# Patient Record
Sex: Female | Born: 1990 | Race: Black or African American | Hispanic: No | Marital: Single | Smoking: Never smoker
Health system: Southern US, Community
[De-identification: ages and names within clinical notes are randomized; demographics above are authoritative.]

## PROBLEM LIST (undated history)

## (undated) HISTORY — PX: BACK SURGERY: SHX140

---
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
## 2014-10-30 ENCOUNTER — Encounter: Payer: Self-pay | Admitting: Emergency Medicine

## 2014-10-30 ENCOUNTER — Emergency Department
Admission: EM | Admit: 2014-10-30 | Discharge: 2014-10-30 | Disposition: A | Discharge status: Home / Self Care | Payer: Self-pay | Attending: Student | Admitting: Student

## 2014-10-30 DIAGNOSIS — Z23 Encounter for immunization: Secondary | ICD-10-CM | POA: Insufficient documentation

## 2014-10-30 DIAGNOSIS — IMO0002 Reserved for concepts with insufficient information to code with codable children: Secondary | ICD-10-CM

## 2014-10-30 DIAGNOSIS — Y9289 Other specified places as the place of occurrence of the external cause: Secondary | ICD-10-CM | POA: Insufficient documentation

## 2014-10-30 DIAGNOSIS — Y998 Other external cause status: Secondary | ICD-10-CM | POA: Insufficient documentation

## 2014-10-30 DIAGNOSIS — Y9389 Activity, other specified: Secondary | ICD-10-CM | POA: Insufficient documentation

## 2014-10-30 DIAGNOSIS — W260XXA Contact with knife, initial encounter: Secondary | ICD-10-CM | POA: Insufficient documentation

## 2014-10-30 DIAGNOSIS — S61221A Laceration with foreign body of left index finger without damage to nail, initial encounter: Secondary | ICD-10-CM | POA: Insufficient documentation

## 2014-10-30 MED ORDER — TETANUS-DIPHTH-ACELL PERTUSSIS 5-2.5-18.5 LF-MCG/0.5 IM SUSP
INTRAMUSCULAR | Status: AC
  Administered 2014-10-30: 0.5 mL via INTRAMUSCULAR
  Filled 2014-10-30: qty 0.5

## 2014-10-30 MED ORDER — IBUPROFEN 800 MG PO TABS
800.0000 mg | ORAL_TABLET | Freq: Once | ORAL | Status: AC
  Administered 2014-10-30: 800 mg via ORAL

## 2014-10-30 MED ORDER — HYDROCODONE-ACETAMINOPHEN 5-325 MG PO TABS
1.0000 | ORAL_TABLET | Freq: Once | ORAL | Status: AC
  Administered 2014-10-30: 1 via ORAL

## 2014-10-30 MED ORDER — IBUPROFEN 800 MG PO TABS: 800.0000 mg | ORAL_TABLET | Freq: Three times a day (TID) | ORAL | Status: DC | PRN

## 2014-10-30 MED ORDER — HYDROCODONE-ACETAMINOPHEN 5-325 MG PO TABS: 1.0000 | ORAL_TABLET | ORAL | Status: DC | PRN

## 2014-10-30 MED ORDER — IBUPROFEN 800 MG PO TABS
ORAL_TABLET | ORAL | Status: AC
  Administered 2014-10-30: 800 mg via ORAL
  Filled 2014-10-30: qty 1

## 2014-10-30 MED ORDER — TETANUS-DIPHTH-ACELL PERTUSSIS 5-2.5-18.5 LF-MCG/0.5 IM SUSP
0.5000 mL | Freq: Once | INTRAMUSCULAR | Status: AC
  Administered 2014-10-30: 0.5 mL via INTRAMUSCULAR

## 2014-10-30 MED ORDER — HYDROCODONE-ACETAMINOPHEN 5-325 MG PO TABS
ORAL_TABLET | ORAL | Status: AC
  Administered 2014-10-30: 1 via ORAL
  Filled 2014-10-30: qty 1

## 2014-10-30 NOTE — ED Notes (Signed)
Pt presents to ED with laceration to the 2nd digit on her left hand while trying to cut a bag of fries at home with a knife. Bleeding controlled. Laceration noted to tip of finger.

## 2014-10-30 NOTE — ED Notes (Signed)
Pt. Has 2 cm laceration to the tip of the 2nd finger to lt. Hand.  Bleeding controlled at this time.  Pt. Soaking hand.

## 2014-10-30 NOTE — ED Provider Notes (Signed)
[facility] Emergency Department Provider Note ____________________________________________  Time seen: ----------------------------------------- 9:50 PM on 10/30/2014 -----------------------------------------    I have reviewed the triage vital signs and the nursing notes.   HISTORY  Chief Complaint Extremity Laceration    HPI Betty Clark is a 24 y.o. female who presents with laceration to left index, distal finger, occurred prior to arrival with a knife while cutting a bag of fries.  Bleeding controlled.  History reviewed. No pertinent past medical history.  There are no active problems to display for this patient.   Past Surgical History  Procedure Laterality Date  . Back surgery      Current Outpatient Rx  Name  Route  Sig  Dispense  Refill  . HYDROcodone-acetaminophen (NORCO) 5-325 MG per tablet   Oral   Take 1 tablet by mouth every 4 (four) hours as needed for moderate pain.   6 tablet   0   . ibuprofen (ADVIL,MOTRIN) 800 MG tablet   Oral   Take 1 tablet (800 mg total) by mouth every 8 (eight) hours as needed.   15 tablet   0     Allergies Review of patient's allergies indicates no known allergies.  No family history on file.  Social History History  Substance Use Topics  . Smoking status: Never Smoker   . Smokeless tobacco: Never Used  . Alcohol Use: Yes    Review of Systems  Constitutional: Negative for fever. Eyes: Negative for visual changes. ENT: Negative for sore throat. Cardiovascular: Negative for chest pain. Respiratory: Negative for shortness of breath. Gastrointestinal: Negative for abdominal pain, vomiting and diarrhea. Genitourinary: Negative for dysuria. Musculoskeletal: Negative for back pain. Skin: Negative for rash. Neurological: Negative for headaches, focal weakness or numbness.   10-point ROS otherwise negative.  ____________________________________________   PHYSICAL EXAM:  VITAL  SIGNS: ED Triage Vitals  Enc Vitals Group     BP 10/30/14 2023 128/78 mmHg     Pulse Rate 10/30/14 2023 64     Resp --      Temp 10/30/14 2023 98.6 F (37 C)     Temp Source 10/30/14 2023 Oral     SpO2 10/30/14 2023 99 %     Weight 10/30/14 2023 210 lb (95.255 kg)     Height 10/30/14 2023 5\' 8"  (1.727 m)     Head Cir --      Peak Flow --      Pain Score 10/30/14 2024 8     Pain Loc --      Pain Edu? --      Excl. in GC? --     Constitutional: Alert and oriented. Well appearing and in no distress.  Skin:  Skin is warm, dry and intact. No rash noted. 1cm laceration to index finger, left. Psychiatric: Mood and affect are normal. Speech and behavior are normal. Patient exhibits appropriate insight and judgment.  ____________________________________________    LABS (pertinent positives/negatives)    ____________________________________________   EKG    ____________________________________________    RADIOLOGY  Not indicated.  ____________________________________________   PROCEDURES  Procedure(s) performed: LACERATION REPAIR Performed by: Ignacia Bayleyobert Oretha Weismann Authorized by: Ignacia Bayleyobert Connor Foxworthy Consent: Verbal consent obtained. Risks and benefits: risks, benefits and alternatives were discussed Consent given by: patient Patient identity confirmed: provided demographic data Prepped and Draped in normal sterile fashion Wound explored  Laceration Location: left index, distal tip  Laceration Length: one cm  No Foreign Bodies seen or palpated  Anesthesia: local infiltration  Local anesthetic: lidocaine  1%  no epinephrine  Anesthetic total: 3 ml  Irrigation method: syringe Amount of cleaning: standard   Number of sutures: 3  Technique: simple interrupted  Patient tolerance: Patient tolerated the procedure well with no immediate complications.   Critical Care performed: No  ____________________________________________   INITIAL IMPRESSION / ASSESSMENT AND  PLAN / ED COURSE  Laceration  See procedure note.   Given ibuprofen and norco for pain control.  Follow up 7 days for suture removal.  Tdap given in ER.  Pertinent labs & imaging results that were available during my care of the patient were reviewed by me and considered in my medical decision making (see chart for details).  ____________________________________________   FINAL CLINICAL IMPRESSION(S) / ED DIAGNOSES  Final diagnoses:  Laceration      Ignacia Bayleyobert Eann Cleland, PA-C 10/30/14 2231  Gayla DossEryka A Gayle, MD 10/31/14 [id number]

## 2014-11-11 ENCOUNTER — Emergency Department
Admission: EM | Admit: 2014-11-11 | Discharge: 2014-11-11 | Disposition: A | Discharge status: Home / Self Care | Payer: Self-pay | Attending: Emergency Medicine | Admitting: Emergency Medicine

## 2014-11-11 DIAGNOSIS — Z4802 Encounter for removal of sutures: Secondary | ICD-10-CM | POA: Insufficient documentation

## 2014-11-11 NOTE — ED Provider Notes (Signed)
[facility] Emergency Department Provider Note ?____________________________________________ ? Time seen: 7:32 PM on 11/11/2014 ----------------------------------------- ? I have reviewed the triage vital signs and the nursing notes. ________ HISTORY ? Chief Complaint Suture / Staple Removal  HPI  Betty Clark is a 24 y.o. female Patient presents for suture removal.   No past medical history on file.  There are no active problems to display for this patient. ? Past Surgical History  Procedure Laterality Date  . Back surgery     ? Current Outpatient Rx  Name  Route  Sig  Dispense  Refill  . HYDROcodone-acetaminophen (NORCO) 5-325 MG per tablet   Oral   Take 1 tablet by mouth every 4 (four) hours as needed for moderate pain.   6 tablet   0   . ibuprofen (ADVIL,MOTRIN) 800 MG tablet   Oral   Take 1 tablet (800 mg total) by mouth every 8 (eight) hours as needed.   15 tablet   0   ? Allergies Review of patient's allergies indicates no known allergies. ? No family history on file. ? Social History History  Substance Use Topics  . Smoking status: Never Smoker   . Smokeless tobacco: Never Used  . Alcohol Use: Yes   Review of Systems Constitutional: Negative for fever. HEENT:  Normocephalic/atraumatic. Negative for visual/hearingchanges, sore throat, or nasal congestion. Cardiovascular: Negative for chest pain. Respiratory: Negative for shortness of breath. Musculoskeletal: Negative for back pain. Skin: Denies pain, redness, or discharge Neurological: Negative for headaches, focal weakness or numbness. Hematological/Lymphatic:Negative for enlarged lymph nodes  10-point ROS otherwise negative. ____________________________________________  PHYSICAL EXAM:  VITAL SIGNS: ED Triage Vitals  Enc Vitals Group     BP 11/11/14 1912 128/78 mmHg     Pulse Rate 11/11/14 1912 76     Resp 11/11/14 1912 18     Temp 11/11/14 1912 97.9 F (36.6  C)     Temp Source 11/11/14 1912 Oral     SpO2 11/11/14 1912 100 %     Weight 11/11/14 1912 210 lb (95.255 kg)     Height 11/11/14 1912 5\' 8"  (1.727 m)     Head Cir --      Peak Flow --      Pain Score 11/11/14 1913 0     Pain Loc --      Pain Edu? --      Excl. in GC? --    Constitutional: Alert and oriented. Well appearing and in no distress. HEENT: Normocephalic and atraumatic.Conjunctivae are normal. PERRL. Normal extraocular movements. Mucous membranes are moist. Skin:  The wound is well healed without signs of infection. Psychiatric: Mood and affect are normal. Speech and behavior are normal. Patient exhibits appropriate insight and judgment. _____________ PROCEDURES ? SUTURE REMOVAL Performed by: Lissa HoardMenshew, Jissell Trafton V Bacon  Consent: Verbal consent obtained. Consent given by: patient Required items: required blood products, implants, devices, and special equipment available Time out: Immediately prior to procedure a "time out" was called to verify the correct patient, procedure, equipment, support staff and site/side marked as required.  Location: Left Index (2nd) Fingertip  Wound Appearance: clean  Sutures/Staples Removed: 3  Patient tolerance: Patient tolerated the procedure well with no immediate complications. ______________________________________________________ INITIAL IMPRESSION / ASSESSMENT AND PLAN / ED COURSE ? The sutures are removed. Wound care and activity instructions given. Return prn.  Pertinent labs & imaging results that were available during my care of the patient were reviewed by me and considered in my medical decision  making (see chart for details).  ____________________________________________ FINAL CLINICAL IMPRESSION(S) / ED DIAGNOSES?  Final diagnoses:  Visit for suture removal      Lissa HoardJenise V Bacon Modine Oppenheimer, PA-C 11/11/14 1938  Charlesetta IvoryJenise V Bacon Boulevard GardensMenshew, PA-C 11/11/14 1949  Governor Rooksebecca Lord, MD 11/11/14 2149

## 2014-11-11 NOTE — Discharge Instructions (Signed)
Suture Removal, Care After Refer to this sheet in the next few weeks. These instructions provide you with information on caring for yourself after your procedure. Your health care provider may also give you more specific instructions. Your treatment has been planned according to current medical practices, but problems sometimes occur. Call your health care provider if you have any problems or questions after your procedure. WHAT TO EXPECT AFTER THE PROCEDURE After your stitches (sutures) are removed, it is typical to have the following:  Some discomfort and swelling in the wound area.  Slight redness in the area. HOME CARE INSTRUCTIONS   If you have skin adhesive strips over the wound area, do not take the strips off. They will fall off on their own in a few days. If the strips remain in place after 14 days, you may remove them.  Change any bandages (dressings) at least once a day or as directed by your health care provider. If the bandage sticks, soak it off with warm, soapy water.  Apply cream or ointment only as directed by your health care provider. If using cream or ointment, wash the area with soap and water 2 times a day to remove all the cream or ointment. Rinse off the soap and pat the area dry with a clean towel.  Keep the wound area dry and clean. If the bandage becomes wet or dirty, or if it develops a bad smell, change it as soon as possible.  Continue to protect the wound from injury.  Use sunscreen when out in the sun. New scars become sunburned easily. SEEK MEDICAL CARE IF:  You have increasing redness, swelling, or pain in the wound.  You see pus coming from the wound.  You have a fever.  You notice a bad smell coming from the wound or dressing.  Your wound breaks open (edges not staying together). Document Released: 03/08/2001 Document Revised: 04/03/2013 Document Reviewed: 01/23/2013 [facility] Patient Information 2015 [employer] [place]. This information is not  intended to replace advice given to you by your health care provider. Make sure you discuss any questions you have with your health care provider.   Keep the wound clean, dry, and covered.

## 2014-11-11 NOTE — ED Notes (Signed)
Had suture placed to left 3rd finger and is here for suture removal

## 2015-12-30 ENCOUNTER — Emergency Department
Admission: EM | Admit: 2015-12-30 | Discharge: 2015-12-30 | Disposition: A | Discharge status: Home / Self Care | Payer: Self-pay | Attending: Emergency Medicine | Admitting: Emergency Medicine

## 2015-12-30 ENCOUNTER — Emergency Department: Payer: Self-pay

## 2015-12-30 DIAGNOSIS — S61307A Unspecified open wound of left little finger with damage to nail, initial encounter: Secondary | ICD-10-CM | POA: Insufficient documentation

## 2015-12-30 DIAGNOSIS — Y929 Unspecified place or not applicable: Secondary | ICD-10-CM | POA: Insufficient documentation

## 2015-12-30 DIAGNOSIS — S61309A Unspecified open wound of unspecified finger with damage to nail, initial encounter: Secondary | ICD-10-CM

## 2015-12-30 DIAGNOSIS — Y999 Unspecified external cause status: Secondary | ICD-10-CM | POA: Insufficient documentation

## 2015-12-30 DIAGNOSIS — Y9389 Activity, other specified: Secondary | ICD-10-CM | POA: Insufficient documentation

## 2015-12-30 DIAGNOSIS — S61305A Unspecified open wound of left ring finger with damage to nail, initial encounter: Secondary | ICD-10-CM | POA: Insufficient documentation

## 2015-12-30 MED ORDER — HYDROCODONE-ACETAMINOPHEN 5-325 MG PO TABS
1.0000 | ORAL_TABLET | Freq: Three times a day (TID) | ORAL | Status: DC | PRN
Start: 2015-12-30 — End: 2024-03-25

## 2015-12-30 MED ORDER — BACITRACIN ZINC 500 UNIT/GM EX OINT
TOPICAL_OINTMENT | CUTANEOUS | Status: AC
  Filled 2015-12-30: qty 1.8

## 2015-12-30 MED ORDER — BACITRACIN ZINC 500 UNIT/GM EX OINT
TOPICAL_OINTMENT | CUTANEOUS | Status: AC
  Filled 2015-12-30: qty 0.9

## 2015-12-30 MED ORDER — BACITRACIN ZINC 500 UNIT/GM EX OINT
TOPICAL_OINTMENT | Freq: Every day | CUTANEOUS | Status: DC
  Administered 2015-12-30: via TOPICAL

## 2015-12-30 MED ORDER — LIDOCAINE HCL (PF) 1 % IJ SOLN
INTRAMUSCULAR | Status: DC
Start: 2015-12-30 — End: 2015-12-31
  Filled 2015-12-30: qty 5

## 2015-12-30 MED ORDER — LIDOCAINE HCL (PF) 1 % IJ SOLN
5.0000 mL | Freq: Once | INTRAMUSCULAR | Status: AC
  Administered 2015-12-30: 5 mL
  Filled 2015-12-30: qty 5
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
## 2015-12-30 NOTE — Discharge Instructions (Signed)
Keep the wounds clean, dry, and covered. Change the dressing daily as needed. Use antibiotic ointment with dressing changes. Wear the splint for protection of the nail bed. Return to the ED in 10-14 days for suture removal. Return at any time for wound checks.   Fingernail or Toenail Removal Fingernail or toenail removal is a surgical procedure to take off a nail from your finger or your toe. You may need to have a fingernail or toenail removed if it has an abnormal shape (deformity) or if it is severely injured. A fingernail or toenail may also be removed due to a bacterial infection, a severe ingrown toenail, or a fungal infection that has failed treatment with antifungal medicines. LET [facility] PROVIDER KNOW ABOUT:  Any allergies you have.  All medicines you are taking, including vitamins, herbs, eye drops, creams, and over-the-counter medicines.  Previous problems you or members of your family have had with the use of anesthetics.  Any blood disorders you have.  Previous surgeries you have had.  Any medical conditions you may have. RISKS AND COMPLICATIONS Generally, this is a safe procedure. However, problems may occur, including:  Pain.  Bleeding.  Infection.  Regrowth of a deformed nail. BEFORE THE PROCEDURE  Ask your health care provider about changing or stopping your regular medicines. This is especially important if you are taking diabetes medicines or blood thinners.  Follow instructions from your health care provider about eating or drinking restrictions.  Plan to have someone take you home after the procedure. PROCEDURE  An IV tube will be inserted into one of your veins.  You will be given one or more of the following:  A medicine that helps you relax (sedative).  A medicine that numbs the area (local anesthetic).  After your toe or finger is numb, your health care provider will insert a blunt instrument under your nail to lift it up.  In some  cases, your health care provider may also make a cut (incision) in your nail.  After your nail is lifted away from your toe or finger, your health care provider will detach it from your nail bed.  A germ-killing bandage (antiseptic dressing) will be put on your toe or finger. The procedure may vary among health care providers and hospitals. AFTER THE PROCEDURE  Your blood pressure, heart rate, breathing rate, and blood oxygen level will be monitored often until the medicines you were given have worn off.  It is common to have some pain after nail removal. You will be given pain medicine as needed.  You may be given a prescription for pain medicine and antibiotic medicine.  If you had a toenail removed, you will be given a surgical shoe to wear while you recover.  If you had a fingernail removed, you may be given a finger splint to wear while you recover.   This information is not intended to replace advice given to you by your health care provider. Make sure you discuss any questions you have with your health care provider.   Document Released: 03/12/2003 Document Revised: 10/28/2014 Document Reviewed: 06/11/2014 Elsevier Interactive Patient Education 2016 Elsevier Inc.  Nail Avulsion Injury Nail avulsion means that you have lost the whole, or part of a nail. The nail will usually grow back in 2 to 6 months. If your injury damaged the growth center of the nail, the nail may be deformed, split, or not stuck to the nail bed. Sometimes the avulsed nail is stitched back in place.  This provides temporary protection to the nail bed until the new nail grows in.  HOME CARE INSTRUCTIONS   Raise (elevate) your injury as much as possible.  Protect the injury and cover it with bandages (dressings) or splints as instructed.  Change dressings as instructed. SEEK MEDICAL CARE IF:   There is increasing pain, redness, or swelling.  You cannot move your fingers or toes.   This information is not  intended to replace advice given to you by your health care provider. Make sure you discuss any questions you have with your health care provider.   Document Released: 07/21/2004 Document Revised: 09/05/2011 Document Reviewed: 05/15/2009 Elsevier Interactive Patient Education [employer] Elsevier Inc.
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
## 2016-01-18 ENCOUNTER — Encounter: Payer: Self-pay | Admitting: Emergency Medicine

## 2016-01-18 ENCOUNTER — Emergency Department
Admission: EM | Admit: 2016-01-18 | Discharge: 2016-01-18 | Disposition: A | Discharge status: Home / Self Care | Payer: Self-pay | Attending: Emergency Medicine | Admitting: Emergency Medicine

## 2016-01-18 DIAGNOSIS — Z4802 Encounter for removal of sutures: Secondary | ICD-10-CM | POA: Insufficient documentation

## 2016-01-18 DIAGNOSIS — Z5189 Encounter for other specified aftercare: Secondary | ICD-10-CM

## 2016-01-18 NOTE — ED Provider Notes (Signed)
[facility] Emergency Department Provider Note ____________________________________________  Time seen: 2108  I have reviewed the triage vital signs and the nursing notes.  HISTORY  Chief Complaint  Suture / Staple Removal  HPI Betty Clark is a 25 y.o. female returns to the ED for wound check and suture removal. I treated this patient approximately 3 weeks prior for traumatic nail avulsion 2. She was treated with an artificial nail bed splint at the time. She returns for wound check and suture removal as directed. She denies any interim complaints.  History reviewed. No pertinent past medical history.  There are no active problems to display for this patient.   Past Surgical History:  Procedure Laterality Date  . BACK SURGERY      Current Outpatient Rx  . Order #: [id number] Class: Print  . Order #: [id number] Class: Print    Allergies Review of patient's allergies indicates no known allergies.  No family history on file.  Social History Social History  Substance Use Topics  . Smoking status: Never Smoker  . Smokeless tobacco: Never Used  . Alcohol use Yes    Review of Systems  Constitutional: Negative for fever. Musculoskeletal: Negative for back pain. Skin: Negative for rash. Nailbed splint in place Neurological: Negative for headaches, focal weakness or numbness. ____________________________________________  PHYSICAL EXAM:  VITAL SIGNS: ED Triage Vitals [01/18/16 2032]  Enc Vitals Group     BP (!) 123/57     Pulse Rate (!) 53     Resp 18     Temp 98 F (36.7 C)     Temp Source Oral     SpO2 98 %     Weight 220 lb (99.8 kg)     Height 5\' 8"  (1.727 m)     Head Circumference      Peak Flow      Pain Score      Pain Loc      Pain Edu?      Excl. in GC?    Constitutional: Alert and oriented. Well appearing and in no distress. Head: Normocephalic and atraumatic. Cardiovascular: Normal distal pulses and cap  refill Musculoskeletal: Nontender with normal range of motion in all extremities.  Neurologic:  Normal gait without ataxia. Normal speech and language. No gross focal neurologic deficits are appreciated. Skin:  Skin is warm, dry and intact. No rash noted. Left ring finger with stable, well-healed nailbed. Nailbed splint in place with a single nylon suture in figure-8 fashion.  ____________________________________________  PROCEDURES  SUTURE REMOVAL Performed by: Lissa Hoard  Consent: Verbal consent obtained. Patient identity confirmed: provided demographic data Time out: Immediately prior to procedure a "time out" was called to verify the correct patient, procedure, equipment, support staff and site/side marked as required.  Location details: left ring finger  Wound Appearance: clean  Sutures/Staples Removed: 1 suture  Facility: sutures placed in this facility Patient tolerance: Patient tolerated the procedure well with no immediate complications. ____________________________________________  INITIAL IMPRESSION / ASSESSMENT AND PLAN / ED COURSE  Patient with a traumatic nail avulsion status post artificial nail splint. She returns today for suture removal. Patient's nailbed is well-healed without incidence.  Clinical Course   ____________________________________________  FINAL CLINICAL IMPRESSION(S) / ED DIAGNOSES  Final diagnoses:  Visit for suture removal  Visit for wound check     Lissa Hoard, PA-C 01/19/16 0025    Myrna Blazer, MD 01/19/16 [phone]

## 2016-01-18 NOTE — ED Notes (Addendum)
Pt in via triage for suture removal; sutures located along bed of fingernail to left fourth digit.  Pt denies any pain.

## 2016-01-18 NOTE — ED Triage Notes (Signed)
Patient ambulatory to triage with steady gait, without difficulty or distress noted; st here for stitch removal of left 4th finger; denies any c/o; st was told to come back here for removal

## 2016-02-03 ENCOUNTER — Emergency Department
Admission: EM | Admit: 2016-02-03 | Discharge: 2016-02-03 | Disposition: A | Discharge status: Home / Self Care | Payer: Self-pay | Attending: Emergency Medicine | Admitting: Emergency Medicine

## 2016-02-03 DIAGNOSIS — K029 Dental caries, unspecified: Secondary | ICD-10-CM | POA: Insufficient documentation

## 2016-02-03 MED ORDER — LIDOCAINE VISCOUS 2 % MT SOLN: 5.0000 mL | Freq: Four times a day (QID) | OROMUCOSAL | 0 refills | Status: DC | PRN

## 2016-02-03 MED ORDER — IBUPROFEN 600 MG PO TABS: 600.0000 mg | ORAL_TABLET | Freq: Three times a day (TID) | ORAL | 0 refills | Status: DC | PRN

## 2016-02-03 MED ORDER — AMOXICILLIN 500 MG PO CAPS: 500.0000 mg | ORAL_CAPSULE | Freq: Three times a day (TID) | ORAL | 0 refills | Status: DC

## 2016-02-03 NOTE — ED Notes (Signed)
Pt to ed with c/o dental pain  X 2 weeks states she does not have insurance and can't afford to have it out. Pt with small amount of swelling to face.

## 2016-02-03 NOTE — ED Provider Notes (Signed)
[facility] Emergency Department Provider Note   ____________________________________________   First MD Initiated Contact with Patient 02/03/16 [phone]     (approximate)  I have reviewed the triage vital signs and the nursing notes.   HISTORY  Chief Complaint Dental Pain    HPI Betty Clark is a 25 y.o. female patient complaining of dental pain for 2-1/2 weeks. Patient stated pain is located in the right upper molars. Patient states pain is increasing in the past 3-4 days. Patient is rating the pain as a 10 over 10. Patient denies any fever or swelling. No palliative measures taken for this complaint.   No past medical history on file.  There are no active problems to display for this patient.   Past Surgical History:  Procedure Laterality Date  . BACK SURGERY      Prior to Admission medications   Medication Sig Start Date End Date Taking? Authorizing Provider  amoxicillin (AMOXIL) 500 MG capsule Take 1 capsule (500 mg total) by mouth 3 (three) times daily. 02/03/16   Joni Reiningonald K Kasey Hansell, PA-C  HYDROcodone-acetaminophen (NORCO) 5-325 MG tablet Take 1 tablet by mouth 3 (three) times daily as needed for moderate pain. 12/30/15   Jenise V Bacon Menshew, PA-C  ibuprofen (ADVIL,MOTRIN) 600 MG tablet Take 1 tablet (600 mg total) by mouth every 8 (eight) hours as needed. 02/03/16   Joni Reiningonald K Stefanee Mckell, PA-C  ibuprofen (ADVIL,MOTRIN) 800 MG tablet Take 1 tablet (800 mg total) by mouth every 8 (eight) hours as needed. 10/30/14   Ignacia Bayleyobert Tumey, PA-C  lidocaine (XYLOCAINE) 2 % solution Use as directed 5 mLs in the mouth or throat every 6 (six) hours as needed for mouth pain. 02/03/16   Joni Reiningonald K Mara Favero, PA-C    Allergies Review of patient's allergies indicates no known allergies.  No family history on file.  Social History Social History  Substance Use Topics  . Smoking status: Never Smoker  . Smokeless tobacco: Never Used  . Alcohol use Yes    Review of  Systems Constitutional: No fever/chills Eyes: No visual changes. ENT: No sore throat. Dental pain Cardiovascular: Denies chest pain. Respiratory: Denies shortness of breath. Gastrointestinal: No abdominal pain.  No nausea, no vomiting.  No diarrhea.  No constipation. Genitourinary: Negative for dysuria. Musculoskeletal: Negative for back pain. Skin: Negative for rash. Neurological: Negative for headaches, focal weakness or numbness.    ____________________________________________   PHYSICAL EXAM:  VITAL SIGNS: ED Triage Vitals  Enc Vitals Group     BP 02/03/16 0657 140/89     Pulse Rate 02/03/16 0657 71     Resp --      Temp 02/03/16 0657 98.2 F (36.8 C)     Temp Source 02/03/16 0657 Oral     SpO2 02/03/16 0657 99 %     Weight 02/03/16 0656 220 lb (99.8 kg)     Height 02/03/16 0656 5\' 8"  (1.727 m)     Head Circumference --      Peak Flow --      Pain Score 02/03/16 0656 10     Pain Loc --      Pain Edu? --      Excl. in GC? --     Constitutional: Alert and oriented. Well appearing and in no acute distress. Eyes: Conjunctivae are normal. PERRL. EOMI. Head: Atraumatic. Nose: No congestion/rhinnorhea. Mouth/Throat: Mucous membranes are moist. Caries identified at tooth #8 14 and 15. Oropharynx nonedematous and non-erythematous. Neck: No stridor.  No cervical spine  tenderness to palpation. Hematological/Lymphatic/Immunilogical: No cervical lymphadenopathy. Cardiovascular: Normal rate, regular rhythm. Grossly normal heart sounds.  Good peripheral circulation. Respiratory: Normal respiratory effort.  No retractions. Lungs CTAB. Gastrointestinal: Soft and nontender. No distention. No abdominal bruits. No CVA tenderness. Musculoskeletal: No lower extremity tenderness nor edema.  No joint effusions. Neurologic:  Normal speech and language. No gross focal neurologic deficits are appreciated. No gait instability. Skin:  Skin is warm, dry and intact. No rash  noted. Psychiatric: Mood and affect are normal. Speech and behavior are normal.  ____________________________________________   LABS (all labs ordered are listed, but only abnormal results are displayed)  Labs Reviewed - No data to display ____________________________________________  EKG   ____________________________________________  RADIOLOGY   ____________________________________________   PROCEDURES  Procedure(s) performed: None  Procedures  Critical Care performed: No  ____________________________________________   INITIAL IMPRESSION / ASSESSMENT AND PLAN / ED COURSE  Pertinent labs & imaging results that were available during my care of the patient were reviewed by me and considered in my medical decision making (see chart for details).  Dental pain secondary to caries. Patient given discharge care instructions. Patient given a list of dental clinics for follow-up. Patient given a prescription for amoxicillin, ibuprofen, and viscous lidocaine. Patient given a work note for today.  Clinical Course     ____________________________________________   FINAL CLINICAL IMPRESSION(S) / ED DIAGNOSES  Final diagnoses:  Pain due to dental caries      NEW MEDICATIONS STARTED DURING THIS VISIT:  New Prescriptions   AMOXICILLIN (AMOXIL) 500 MG CAPSULE    Take 1 capsule (500 mg total) by mouth 3 (three) times daily.   IBUPROFEN (ADVIL,MOTRIN) 600 MG TABLET    Take 1 tablet (600 mg total) by mouth every 8 (eight) hours as needed.   LIDOCAINE (XYLOCAINE) 2 % SOLUTION    Use as directed 5 mLs in the mouth or throat every 6 (six) hours as needed for mouth pain.     Note:  This document was prepared using Dragon voice recognition software and may include unintentional dictation errors.    Joni Reining, PA-C 02/03/16 0725    Jennye Moccasin, MD 02/03/16 [phone]

## 2016-02-03 NOTE — Discharge Instructions (Signed)
Follow-up for febrile illness of dental clinics provided OPTIONS FOR DENTAL FOLLOW UP CARE  [place] Department of Health and Human Services - Local Safety Net Dental Clinics [email or web address]://www.ncdhhs.gov/dph/oralhealth/services/safetynetclinics.htm   [facility] [phone])  [facility] [phone])  [facility] [phone]  [facility] [phone])  [facility] [phone]) This clinic caters to the indigent population and is on a lottery system. Location: [employer] [address] [facility], [address], [place] Clinic Hours: Wednesdays from 6pm - 9pm, patients seen by a lottery system. For dates, call or go to [email or web address] Services: Cleanings, fillings and simple extractions. Payment Options: DENTAL WORK IS FREE OF CHARGE. Bring proof of income or support. Best way to get seen: Arrive at 5:15 pm - this is a lottery, NOT first come/first serve, so arriving earlier will not increase your chances of being seen.     [facility] [phone] Select option 1 for emergencies   Location: [facility], [place], [address], [place] Clinic Hours: No walk-ins accepted - call the day before to schedule an appointment. Check in times are 9:30 am and 1:30 pm. Services: Simple extractions, temporary fillings, pulpectomy/pulp debridement, uncomplicated abscess drainage. Payment Options: PAYMENT IS DUE AT THE TIME OF SERVICE.  Fee is usually $100-200, additional surgical procedures (e.g. abscess drainage) may be extra. Cash, checks, Visa/MasterCard accepted.  Can file Medicaid if patient is covered for dental - patient should call case worker to check. No discount for [facility] patients. Best way to get seen: MUST call the day before and get onto the schedule. Can usually be seen the next 1-2 days. No walk-ins accepted.      [facility] [phone]   Location: [facility], [address], [place] Clinic Hours: M, W, Th, F 8am or 1:30pm, Tues 9a or 1:30 - first come/first served. Services: Simple extractions, temporary fillings, uncomplicated abscess drainage.  You do not need to be [place] resident. Payment Options: PAYMENT IS DUE AT THE TIME OF SERVICE. Dental insurance, otherwise sliding scale - bring proof of income or support. Depending on income and treatment needed, cost is usually $50-200. Best way to get seen: Arrive early as it is first come/first served.     [facility] [phone]   Location: [address] Clinic Hours: Mon-Thu 8a-5p Services: Most basic dental services including extractions and fillings. Payment Options: PAYMENT IS DUE AT THE TIME OF SERVICE. Sliding scale, up to 50% off - bring proof if income or support. Medicaid with dental option accepted. Best way to get seen: Call to schedule an appointment, can usually be seen within 2 weeks OR they will try to see walk-ins - show up at 8a or 2p (you may have to wait).     [facility] [phone] [place] RESIDENTS ONLY   Location: [facility], [address], [place], [place] [id number] Clinic Hours: By appointment only. Monday - Thursday 8am-5pm, Friday 8am-12pm Services: Cleanings, fillings, extractions. Payment Options: PAYMENT IS DUE AT THE TIME OF SERVICE. Cash, Visa or MasterCard. Sliding scale - $30 minimum per service. Best way to get seen: Come in to office, complete packet and make an appointment - need proof of income or support monies for each household member and proof of [facility] [place] residence. Usually takes about a month to get in.     [facility] °[phone] °  °Location: °[address] [place] °Clinic Hours: Walk-in Urgent Care  Dental Services are offered Monday-Friday mornings only. °The numbers of emergencies accepted daily is limited to the number of °providers available. °Maximum 15 - Mondays, Wednesdays & Thursdays °Maximum 10 - Tuesdays & Fridays °Services: °You do not need to be a [place] resident to be seen for a dental emergency. °Emergencies are defined as pain, swelling, abnormal bleeding, or dental trauma. Walkins will receive x-rays if needed. °NOTE: Dental cleaning is not an emergency. °Payment Options: °PAYMENT IS DUE AT THE TIME OF SERVICE. °Minimum co-pay is $40.00 for uninsured patients. °Minimum co-pay is $3.00 for Medicaid with dental coverage. °Dental Insurance is accepted and must be presented at time of visit. °Medicare does not cover dental. °Forms of payment: Cash, credit card, checks. °Best way to get seen: °If not previously registered with the clinic, walk-in dental registration begins at 7:15 am and is on a first come/first serve basis. °If previously registered with the clinic, call to make an appointment. °  °  °[facility] °[phone] °[place] RESIDENTS ONLY °  °Location: °[address], [place], [place] °Clinic Hours: °Mon-Thu 10a-2p °Services: Extractions only! °Payment Options: °FREE (donations accepted) - bring proof of income or support °Best way to get seen: °Call and schedule an appointment OR come at 8am on the 1st Monday of every month (except for holidays) when it is first come/first served. °  °  °[facility] °[phone] °  °Location: °[address], [place] °Clinic Hours: °Friday mornings °Services, Payment Options, Best way to get seen: °Call for info ° °

## 2016-02-03 NOTE — ED Triage Notes (Signed)
Patient ambulatory to triage with steady gait, without difficulty or distress noted; pt reports right upper dental pain x 2wks

## 2016-07-21 ENCOUNTER — Encounter: Payer: Self-pay | Admitting: Emergency Medicine

## 2016-07-21 ENCOUNTER — Emergency Department
Admission: EM | Admit: 2016-07-21 | Discharge: 2016-07-21 | Disposition: A | Discharge status: Home / Self Care | Payer: Self-pay | Attending: Emergency Medicine | Admitting: Emergency Medicine

## 2016-07-21 DIAGNOSIS — B009 Herpesviral infection, unspecified: Secondary | ICD-10-CM | POA: Insufficient documentation

## 2016-07-21 DIAGNOSIS — N3000 Acute cystitis without hematuria: Secondary | ICD-10-CM | POA: Insufficient documentation

## 2016-07-21 LAB — URINALYSIS, COMPLETE (UACMP) WITH MICROSCOPIC
Bacteria, UA: NONE SEEN
Bilirubin Urine: NEGATIVE
GLUCOSE, UA: NEGATIVE mg/dL
Hgb urine dipstick: NEGATIVE
KETONES UR: 5 mg/dL — AB
Nitrite: POSITIVE — AB
PROTEIN: 30 mg/dL — AB
Specific Gravity, Urine: 1.034 — ABNORMAL HIGH (ref 1.005–1.030)
pH: 6 (ref 5.0–8.0)

## 2016-07-21 MED ORDER — TRAMADOL HCL 50 MG PO TABS: 50.0000 mg | ORAL_TABLET | Freq: Four times a day (QID) | ORAL | 0 refills | Status: DC | PRN

## 2016-07-21 MED ORDER — LIDOCAINE HCL 2 % EX GEL
1.0000 "application " | Freq: Once | CUTANEOUS | Status: AC
  Administered 2016-07-21: 1 via TOPICAL
  Filled 2016-07-21: qty 5

## 2016-07-21 MED ORDER — LIDOCAINE 5 % EX OINT: 1.0000 "application " | TOPICAL_OINTMENT | CUTANEOUS | 0 refills | Status: DC | PRN

## 2016-07-21 MED ORDER — ACYCLOVIR 200 MG PO CAPS: 200.0000 mg | ORAL_CAPSULE | Freq: Three times a day (TID) | ORAL | 0 refills | Status: AC

## 2016-07-21 MED ORDER — SULFAMETHOXAZOLE-TRIMETHOPRIM 800-160 MG PO TABS: 1.0000 | ORAL_TABLET | Freq: Two times a day (BID) | ORAL | 0 refills | Status: DC

## 2016-07-21 NOTE — ED Notes (Signed)
See triage note  States she developed some dysuria since Tuesday

## 2016-07-21 NOTE — ED Provider Notes (Signed)
[facility] Emergency Department Provider Note  ____________________________________________  Time seen: Approximately 3:51 PM  I have reviewed the triage vital signs and the nursing notes.   HISTORY  Chief Complaint Dysuria    HPI Betty Clark is a 26 y.o. female who presents to the emergency department for evaluation of dysuria. Symptoms started 2 days ago. She also feels she may be having a herpes outbreak.She is awaiting the results of the herpes virus test from the health department. She reports her last UTI was about 2 years ago.   History reviewed. No pertinent past medical history.  There are no active problems to display for this patient.   Past Surgical History:  Procedure Laterality Date  . BACK SURGERY      Prior to Admission medications   Medication Sig Start Date End Date Taking? Authorizing Provider  acyclovir (ZOVIRAX) 200 MG capsule Take 1 capsule (200 mg total) by mouth 3 (three) times daily. 07/21/16 07/31/16  Chinita Pesterari B Ajia Chadderdon, FNP  HYDROcodone-acetaminophen (NORCO) 5-325 MG tablet Take 1 tablet by mouth 3 (three) times daily as needed for moderate pain. 12/30/15   Jenise V Bacon Menshew, PA-C  lidocaine (XYLOCAINE) 2 % solution Use as directed 5 mLs in the mouth or throat every 6 (six) hours as needed for mouth pain. 02/03/16   Joni Reiningonald K Smith, PA-C  lidocaine (XYLOCAINE) 5 % ointment Apply 1 application topically as needed. 07/21/16   Chinita Pesterari B Anaiza Behrens, FNP  sulfamethoxazole-trimethoprim (BACTRIM DS,SEPTRA DS) 800-160 MG tablet Take 1 tablet by mouth 2 (two) times daily. 07/21/16   Chinita Pesterari B Reyce Lubeck, FNP  traMADol (ULTRAM) 50 MG tablet Take 1 tablet (50 mg total) by mouth every 6 (six) hours as needed. 07/21/16   Chinita Pesterari B Eduardo Wurth, FNP    Allergies Patient has no known allergies.  No family history on file.  Social History Social History  Substance Use Topics  . Smoking status: Never Smoker  . Smokeless tobacco: Never Used  . Alcohol use  Yes    Review of Systems Constitutional: Negative for fever. Respiratory: Negative for shortness of breath or cough. Gastrointestinal: Negative for abdominal pain; Negative for nausea , Negative for vomiting. Genitourinary: Positive for dysuria , negative for vaginal discharge. Positive for painful lesions around vagina Musculoskeletal: Negative for back pain. Skin: Positive for painful lesions in vaginal area. ____________________________________________   PHYSICAL EXAM:  VITAL SIGNS: ED Triage Vitals  Enc Vitals Group     BP 07/21/16 1439 (!) 134/94     Pulse Rate 07/21/16 1439 98     Resp 07/21/16 1439 18     Temp 07/21/16 1439 98.4 F (36.9 C)     Temp Source 07/21/16 1439 Oral     SpO2 07/21/16 1439 98 %     Weight 07/21/16 1439 250 lb (113.4 kg)     Height 07/21/16 1439 5\' 7"  (1.702 m)     Head Circumference --      Peak Flow --      Pain Score 07/21/16 1442 10     Pain Loc --      Pain Edu? --      Excl. in GC? --     Constitutional: Alert and oriented. Well appearing and in no acute distress. Eyes: Conjunctivae are normal. PERRL. EOMI. Head: Atraumatic. Nose: No congestion/rhinnorhea. Mouth/Throat: Mucous membranes are moist. Respiratory: Normal respiratory effort.  No retractions. Gastrointestinal: Abdomen is soft, nontender without rebound or guarding. Genitourinary: Pelvic exam:  Musculoskeletal: No extremity tenderness nor edema.  Neurologic:  Normal speech and language. No gross focal neurologic deficits are appreciated. Speech is normal. No gait instability. Skin:  Skin of labia minora and majora with scattered, ulcerated lesions consistent with herpes simplex 2. Psychiatric: Mood and affect are normal. Speech and behavior are normal.  ____________________________________________   LABS (all labs ordered are listed, but only abnormal results are displayed)  Labs Reviewed  URINALYSIS, COMPLETE (UACMP) WITH MICROSCOPIC - Abnormal; Notable for the  following:       Result Value   Color, Urine YELLOW (*)    APPearance CLOUDY (*)    Specific Gravity, Urine 1.034 (*)    Ketones, ur 5 (*)    Protein, ur 30 (*)    Nitrite POSITIVE (*)    Leukocytes, UA MODERATE (*)    Squamous Epithelial / LPF 6-30 (*)    All other components within normal limits   ____________________________________________  RADIOLOGY  Not indicated. ____________________________________________   PROCEDURES  Procedure(s) performed: None  ____________________________________________   INITIAL IMPRESSION / ASSESSMENT AND PLAN / ED COURSE      Pertinent labs & imaging results that were available during my care of the patient were reviewed by me and considered in my medical decision making (see chart for details).  26 year old female presents to the emergency department for evaluation of dysuria and confirmation of herpes simplex 2. Dysuria started 2 days ago. Lesions on the labia started about 3-4 days ago. She denies previous herpes symptoms and this is likely her first outbreak. She will be treated with Bactrim, xylocaine jelly, tramadol, and acyclovir. She was advised to follow up with the health department for symptoms that are not improving with the medications. Safe sexual practices were discussed in detail and at length.  ___________________________________________   FINAL CLINICAL IMPRESSION(S) / ED DIAGNOSES  Final diagnoses:  Acute cystitis without hematuria  Herpes simplex type 2 infection    Note:  This document was prepared using Dragon voice recognition software and may include unintentional dictation errors.    Chinita Pester, FNP 07/21/16 1733    Charlynne Pander, MD 07/24/16 2103

## 2016-07-21 NOTE — ED Triage Notes (Signed)
Patient presents to ED via POV from home with c/o dysuria. Patient states, "It feels like I have a UTI. I also think I am having a herpes outbreak but my test from the health department hasn't come back yet".

## 2017-03-14 ENCOUNTER — Encounter: Payer: Self-pay | Admitting: Emergency Medicine

## 2017-03-14 ENCOUNTER — Emergency Department
Admission: EM | Admit: 2017-03-14 | Discharge: 2017-03-14 | Disposition: A | Discharge status: Home / Self Care | Payer: Self-pay | Attending: Emergency Medicine | Admitting: Emergency Medicine

## 2017-03-14 DIAGNOSIS — J0110 Acute frontal sinusitis, unspecified: Secondary | ICD-10-CM | POA: Insufficient documentation

## 2017-03-14 DIAGNOSIS — Z79899 Other long term (current) drug therapy: Secondary | ICD-10-CM | POA: Insufficient documentation

## 2017-03-14 MED ORDER — AMOXICILLIN-POT CLAVULANATE 875-125 MG PO TABS: 1.0000 | ORAL_TABLET | Freq: Two times a day (BID) | ORAL | 0 refills | Status: AC

## 2017-03-14 MED ORDER — PREDNISONE 10 MG PO TABS: ORAL_TABLET | ORAL | 0 refills | Status: DC

## 2017-03-14 NOTE — ED Triage Notes (Signed)
Presents with sinus pressure and nasal congestion  Sx's started last weds.   Possible fever at night but did not take temp.

## 2017-03-14 NOTE — ED Provider Notes (Signed)
[facility] Emergency Department Provider Note  ____________________________________________  Time seen: Approximately 11:21 AM  I have reviewed the triage vital signs and the nursing notes.   HISTORY  Chief Complaint Facial Pain    HPI Betty Clark is a 26 y.o. female presents to emergency department with nasal congestion and facial pain for 6 days. Patient has chills at night but has not checked her temperature. She does not smoke. She took an allergy pill that her mom gave her, which did not help. No headache, sore throat, cough, shortness of breath, chest pain, nausea, vomiting, abdominal pain, muscle aches.   History reviewed. No pertinent past medical history.  There are no active problems to display for this patient.   Past Surgical History:  Procedure Laterality Date  . BACK SURGERY      Prior to Admission medications   Medication Sig Start Date End Date Taking? Authorizing Provider  amoxicillin-clavulanate (AUGMENTIN) 875-125 MG tablet Take 1 tablet by mouth 2 (two) times daily. 03/14/17 03/24/17  Enid Derry, PA-C  HYDROcodone-acetaminophen (NORCO) 5-325 MG tablet Take 1 tablet by mouth 3 (three) times daily as needed for moderate pain. 12/30/15   Menshew, Charlesetta Ivory, PA-C  lidocaine (XYLOCAINE) 2 % solution Use as directed 5 mLs in the mouth or throat every 6 (six) hours as needed for mouth pain. 02/03/16   Joni Reining, PA-C  lidocaine (XYLOCAINE) 5 % ointment Apply 1 application topically as needed. 07/21/16   Triplett, Rulon Eisenmenger B, FNP  predniSONE (DELTASONE) 10 MG tablet Take 6 tablets on day 1, take 5 tablets on day 2, take 4 tablets on day 3, take 3 tablets on day 4, take 2 tablets on day 5, take 1 tablet on day 6 03/14/17   Enid Derry, PA-C  sulfamethoxazole-trimethoprim (BACTRIM DS,SEPTRA DS) 800-160 MG tablet Take 1 tablet by mouth 2 (two) times daily. 07/21/16   Triplett, Rulon Eisenmenger B, FNP  traMADol (ULTRAM) 50 MG tablet Take 1 tablet  (50 mg total) by mouth every 6 (six) hours as needed. 07/21/16   Chinita Pester, FNP    Allergies Patient has no known allergies.  No family history on file.  Social History Social History  Substance Use Topics  . Smoking status: Never Smoker  . Smokeless tobacco: Never Used  . Alcohol use Yes     Review of Systems  Eyes: No visual changes. No discharge. ENT: Positive for congestion and rhinorrhea. Cardiovascular: No chest pain. Respiratory: Negative for cough. No SOB. Gastrointestinal: No abdominal pain.  No nausea, no vomiting.  No diarrhea.  No constipation. Musculoskeletal: Negative for musculoskeletal pain. Skin: Negative for rash, abrasions, lacerations, ecchymosis. Neurological: Negative for headaches.   ____________________________________________   PHYSICAL EXAM:  VITAL SIGNS: ED Triage Vitals  Enc Vitals Group     BP 03/14/17 1103 133/83     Pulse Rate 03/14/17 1103 (!) 104     Resp 03/14/17 1103 18     Temp 03/14/17 1103 98.4 F (36.9 C)     Temp Source 03/14/17 1103 Oral     SpO2 03/14/17 1103 95 %     Weight 03/14/17 1111 250 lb (113.4 kg)     Height 03/14/17 1111  (1.727 m)     Head Circumference --      Peak Flow --   [facility]     Pain Loc --      Pain Edu? --      Excl.  in GC? --      Constitutional: Alert and oriented. Well appearing and in no acute distress. Eyes: Conjunctivae are normal. PERRL. EOMI. No discharge. Head: Atraumatic. ENT: No frontal and maxillary sinus tenderness.      Ears: Tympanic membranes pearly gray with good landmarks. No discharge.      Nose: Mild congestion/rhinnorhea.      Mouth/Throat: Mucous membranes are moist. Oropharynx non-erythematous. Tonsils not enlarged. No exudates. Uvula midline. Neck: No stridor.   Hematological/Lymphatic/Immunilogical: No cervical lymphadenopathy. Cardiovascular: Normal rate, regular rhythm.  Good peripheral circulation. Respiratory: Normal respiratory  effort without tachypnea or retractions. Lungs CTAB. Good air entry to the bases with no decreased or absent breath sounds. Gastrointestinal: Bowel sounds 4 quadrants. Soft and nontender to palpation. No guarding or rigidity. No palpable masses. No distention. Musculoskeletal: Full range of motion to all extremities. No gross deformities appreciated. Neurologic:  Normal speech and language. No gross focal neurologic deficits are appreciated.  Skin:  Skin is warm, dry and intact. No rash noted.   ____________________________________________   LABS (all labs ordered are listed, but only abnormal results are displayed)  Labs Reviewed - No data to display ____________________________________________  EKG   ____________________________________________  RADIOLOGY   No results found.  ____________________________________________    PROCEDURES  Procedure(s) performed:    Procedures    Medications - No data to display   ____________________________________________   INITIAL IMPRESSION / ASSESSMENT AND PLAN / ED COURSE  Pertinent labs & imaging results that were available during my care of the patient were reviewed by me and considered in my medical decision making (see chart for details).  Review of the [place] CSRS was performed in accordance of the [facility] prior to dispensing any controlled drugs.   Patient's diagnosis is consistent with sinusitis. Vital signs and exam are reassuring. Patient feels comfortable going home. Patient will be discharged home with prescriptions for Augmentin and prednisone. Patient is to follow up with PCP as needed or otherwise directed. Patient is given ED precautions to return to the ED for any worsening or new symptoms.     ____________________________________________  FINAL CLINICAL IMPRESSION(S) / ED DIAGNOSES  Final diagnoses:  Acute non-recurrent frontal sinusitis      NEW MEDICATIONS STARTED DURING THIS VISIT:  New  Prescriptions   AMOXICILLIN-CLAVULANATE (AUGMENTIN) 875-125 MG TABLET    Take 1 tablet by mouth 2 (two) times daily.   PREDNISONE (DELTASONE) 10 MG TABLET    Take 6 tablets on day 1, take 5 tablets on day 2, take 4 tablets on day 3, take 3 tablets on day 4, take 2 tablets on day 5, take 1 tablet on day 6        This chart was dictated using voice recognition software/Dragon. Despite best efforts to proofread, errors can occur which can change the meaning. Any change was purely unintentional.    Enid Derry, PA-C 03/14/17 1136    Alphonzo Lemmings Rudy Jew, MD 03/14/17 [phone]

## 2018-09-15 ENCOUNTER — Emergency Department: Payer: Self-pay

## 2018-09-15 ENCOUNTER — Other Ambulatory Visit: Payer: Self-pay

## 2018-09-15 ENCOUNTER — Emergency Department
Admission: EM | Admit: 2018-09-15 | Discharge: 2018-09-15 | Disposition: A | Discharge status: Home / Self Care | Payer: Self-pay | Attending: Emergency Medicine | Admitting: Emergency Medicine

## 2018-09-15 ENCOUNTER — Encounter: Payer: Self-pay | Admitting: Emergency Medicine

## 2018-09-15 DIAGNOSIS — J111 Influenza due to unidentified influenza virus with other respiratory manifestations: Secondary | ICD-10-CM | POA: Insufficient documentation

## 2018-09-15 DIAGNOSIS — R69 Illness, unspecified: Secondary | ICD-10-CM

## 2018-09-15 DIAGNOSIS — R059 Cough, unspecified: Secondary | ICD-10-CM

## 2018-09-15 DIAGNOSIS — Z79899 Other long term (current) drug therapy: Secondary | ICD-10-CM | POA: Insufficient documentation

## 2018-09-15 DIAGNOSIS — R05 Cough: Secondary | ICD-10-CM

## 2018-09-15 LAB — INFLUENZA PANEL BY PCR (TYPE A & B)
Influenza A By PCR: NEGATIVE
Influenza B By PCR: NEGATIVE

## 2018-09-15 MED ORDER — DEXAMETHASONE SODIUM PHOSPHATE 10 MG/ML IJ SOLN
10.0000 mg | Freq: Once | INTRAMUSCULAR | Status: AC
  Administered 2018-09-15: 10 mg via INTRAMUSCULAR
  Filled 2018-09-15: qty 1

## 2018-09-15 MED ORDER — DM-GUAIFENESIN ER 30-600 MG PO TB12: 1.0000 | ORAL_TABLET | Freq: Every day | ORAL | 0 refills | Status: AC

## 2018-09-15 MED ORDER — PREDNISONE 50 MG PO TABS: ORAL_TABLET | ORAL | 0 refills | Status: DC

## 2018-09-15 NOTE — Discharge Instructions (Signed)
You are to be quarantined at home for the next 2 weeks. Return to the emergency department with worsening shortness of breath, chest tightness or chest pain. You have been prescribed prednisone.  You can also take Mucinex DM daily for cough. Take Tylenol for fever.  Avoid taking ibuprofen at this time.

## 2018-09-15 NOTE — ED Provider Notes (Signed)
[facility] Emergency Department Provider Note  ____________________________________________  Time seen: Approximately 4:32 PM  I have reviewed the triage vital signs and the nursing notes.   HISTORY  Chief Complaint Cough    HPI Betty Clark is a 28 y.o. female presents to the emergency department with nonproductive cough and shortness of breath with cough for the past 3 days.  Patient has also had sore throat.  She denies headache.  She has had rhinorrhea nasal congestion.  No chest pain or chest tightness.  No nausea, vomiting or diarrhea.  Patient has had some body aches.  She was recently around her niece who is sick with similar symptoms.  She denies recent international travel or travel outside of the state.  She resides in [facility].  Prior to development of symptoms, patient was well.  No other alleviating measures have been attempted.        History reviewed. No pertinent past medical history.  There are no active problems to display for this patient.   Past Surgical History:  Procedure Laterality Date  . BACK SURGERY      Prior to Admission medications   Medication Sig Start Date End Date Taking? Authorizing Provider  dextromethorphan-guaiFENesin (MUCINEX DM) 30-600 MG 12hr tablet Take 1 tablet by mouth daily for 7 days. 09/15/18 09/22/18  Orvil Feil, PA-C  HYDROcodone-acetaminophen (NORCO) 5-325 MG tablet Take 1 tablet by mouth 3 (three) times daily as needed for moderate pain. 12/30/15   Menshew, Charlesetta Ivory, PA-C  lidocaine (XYLOCAINE) 2 % solution Use as directed 5 mLs in the mouth or throat every 6 (six) hours as needed for mouth pain. 02/03/16   Joni Reining, PA-C  lidocaine (XYLOCAINE) 5 % ointment Apply 1 application topically as needed. 07/21/16   Triplett, Rulon Eisenmenger B, FNP  predniSONE (DELTASONE) 50 MG tablet Take one 50 mg tablet once daily for the next five days. 09/15/18   Orvil Feil, PA-C   sulfamethoxazole-trimethoprim (BACTRIM DS,SEPTRA DS) 800-160 MG tablet Take 1 tablet by mouth 2 (two) times daily. 07/21/16   Triplett, Rulon Eisenmenger B, FNP  traMADol (ULTRAM) 50 MG tablet Take 1 tablet (50 mg total) by mouth every 6 (six) hours as needed. 07/21/16   Chinita Pester, FNP    Allergies Patient has no known allergies.  No family history on file.  Social History Social History   Tobacco Use  . Smoking status: Never Smoker  . Smokeless tobacco: Never Used  Substance Use Topics  . Alcohol use: Yes  . Drug use: Not on file      Review of Systems  Constitutional: Patient has fever.  Eyes: No visual changes. No discharge ENT: Patient has congestion.  Cardiovascular: no chest pain. Respiratory: Patient has cough.  Gastrointestinal: No abdominal pain.  No nausea, no vomiting. Patient had diarrhea.  Genitourinary: Negative for dysuria. No hematuria Musculoskeletal: Patient has myalgias.  Skin: Negative for rash, abrasions, lacerations, ecchymosis. Neurological: No headache, no focal weakness or numbness.     ____________________________________________   PHYSICAL EXAM:  VITAL SIGNS: ED Triage Vitals  Enc Vitals Group     BP 09/15/18 1502 130/86     Pulse Rate 09/15/18 1502 (!) 119     Resp 09/15/18 1502 20     Temp 09/15/18 1502 98.3 F (36.8 C)     Temp Source 09/15/18 1502 Oral     SpO2 09/15/18 1502 98 %     Weight 09/15/18 1504 245 lb (111.1 kg)  Height 09/15/18 1504  (1.676 m)     Head Circumference --      Peak Flow --      Pain Score 09/15/18 1504 6     Pain Loc --      Pain Edu? --      Excl. in GC? --      Constitutional: Alert and oriented. Patient is lying supine. Eyes: Conjunctivae are normal. PERRL. EOMI. Head: Atraumatic. ENT:      Ears: Tympanic membranes are mildly injected with mild effusion bilaterally.       Nose: No congestion/rhinnorhea.      Mouth/Throat: Mucous membranes are moist. Posterior pharynx is mildly  erythematous.  Hematological/Lymphatic/Immunilogical: No cervical lymphadenopathy.  Cardiovascular: Normal rate, regular rhythm. Normal S1 and S2.  Good peripheral circulation. Respiratory: Normal respiratory effort without tachypnea or retractions. Lungs CTAB. Good air entry to the bases with no decreased or absent breath sounds. Gastrointestinal: Bowel sounds 4 quadrants. Soft and nontender to palpation. No guarding or rigidity. No palpable masses. No distention. No CVA tenderness. Musculoskeletal: Full range of motion to all extremities. No gross deformities appreciated. Neurologic:  Normal speech and language. No gross focal neurologic deficits are appreciated.  Skin:  Skin is warm, dry and intact. No rash noted. Psychiatric: Mood and affect are normal. Speech and behavior are normal. Patient exhibits appropriate insight and judgement.     ____________________________________________   LABS (all labs ordered are listed, but only abnormal results are displayed)  Labs Reviewed  INFLUENZA PANEL BY PCR (TYPE A & B)   ____________________________________________  EKG   ____________________________________________  RADIOLOGY I personally viewed and evaluated these images as part of my medical decision making, as well as reviewing the written report by the radiologist.  Dg Chest 1 View  Result Date: 09/15/2018 CLINICAL DATA:  Cough and shortness of breath for several days EXAM: CHEST  1 VIEW COMPARISON:  None. FINDINGS: Cardiac shadow is within normal limits. The lungs are well aerated bilaterally. Mild bibasilar atelectasis is seen. No focal confluent infiltrate or sizable effusion is noted. No acute bony abnormality is seen. IMPRESSION: Mild bibasilar atelectasis. Electronically Signed   By: Alcide Clever M.D.   On: 09/15/2018 16:11    ____________________________________________    PROCEDURES  Procedure(s) performed:    Procedures    Medications  dexamethasone  (DECADRON) injection 10 mg (10 mg Intramuscular Given 09/15/18 1743)     ____________________________________________   INITIAL IMPRESSION / ASSESSMENT AND PLAN / ED COURSE  Pertinent labs & imaging results that were available during my care of the patient were reviewed by me and considered in my medical decision making (see chart for details).  Review of the [place] CSRS was performed in accordance of the [facility] prior to dispensing any controlled drugs.           Assessment and Plan:  Influenza-like illness Patient presents to the emergency department with cough, fever, pharyngitis and chills for the past 3 days.  Patient tested negative for influenza in the emergency department chest x-ray shows findings consistent with atelectasis.  Patient had no increased work of breathing on physical exam and no signs of respiratory distress.  Patient was assumed to be infected with Covid 19 given guidelines current as of September 15, 2018.  Quarantine precautions were given for 2 weeks.  Patient was discharged with prednisone and Mucinex DM.  Patient was given strict return precautions to return to the emergency department with shortness of breath, chest tightness, chest pain  and increased work of breathing.  Patient voiced understanding.  All patient questions were answered.    ____________________________________________  FINAL CLINICAL IMPRESSION(S) / ED DIAGNOSES  Final diagnoses:  Influenza-like illness      NEW MEDICATIONS STARTED DURING THIS VISIT:  ED Discharge Orders         Ordered    predniSONE (DELTASONE) 50 MG tablet     09/15/18 1734    dextromethorphan-guaiFENesin (MUCINEX DM) 30-600 MG 12hr tablet  Daily     09/15/18 1734              This chart was dictated using voice recognition software/Dragon. Despite best efforts to proofread, errors can occur which can change the meaning. Any change was purely unintentional.    Orvil Feil, PA-C 09/15/18 1804     Phineas Semen, MD 09/15/18 [phone]

## 2018-09-15 NOTE — ED Notes (Signed)
Pt verbalized understanding of d/c instructions, RX, and f/u care. No further questions at this time. Pt ambulatory to the exit with steady gait  

## 2018-09-15 NOTE — ED Triage Notes (Signed)
Cough x 3 days. Denies having had flu vaccine.

## 2019-08-02 IMAGING — DX CHEST  1 VIEW
1 series · 1 of 1 positions shown · non-contrast
Comparison: None.

CLINICAL DATA: Cough and shortness of breath for several days

EXAM:
CHEST  1 VIEW

[chest ap]
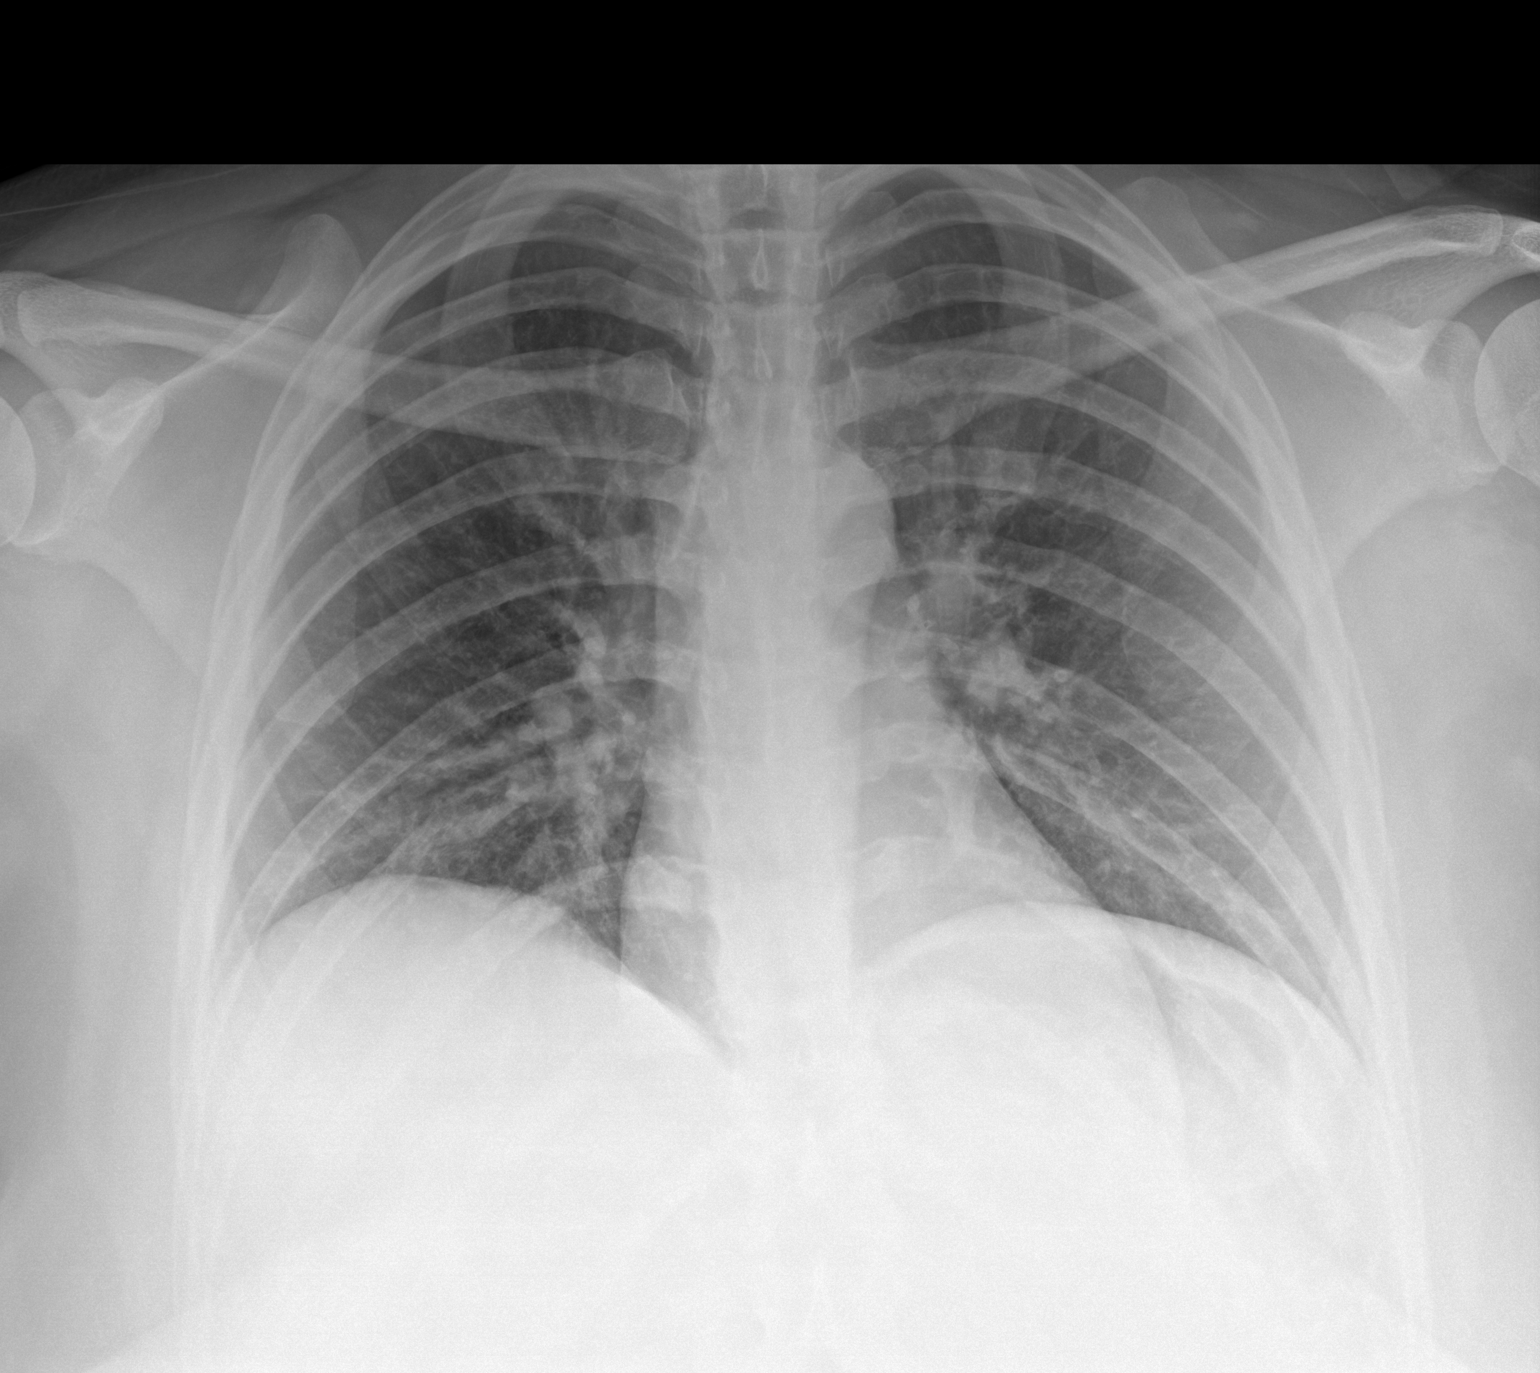

[1 of 1 positions shown; findings below may reference images not displayed]

FINDINGS: Cardiac shadow is within normal limits. The lungs are well aerated
bilaterally. Mild bibasilar atelectasis is seen. No focal confluent
infiltrate or sizable effusion is noted. No acute bony abnormality
is seen.
IMPRESSION: Mild bibasilar atelectasis.

## 2023-07-27 DIAGNOSIS — R6889 Other general symptoms and signs: Secondary | ICD-10-CM | POA: Diagnosis not present

## 2023-07-27 DIAGNOSIS — K2970 Gastritis, unspecified, without bleeding: Secondary | ICD-10-CM | POA: Diagnosis not present

## 2023-09-27 DIAGNOSIS — G479 Sleep disorder, unspecified: Secondary | ICD-10-CM | POA: Diagnosis not present

## 2023-09-27 DIAGNOSIS — T7491XA Unspecified adult maltreatment, confirmed, initial encounter: Secondary | ICD-10-CM | POA: Diagnosis not present

## 2023-09-27 DIAGNOSIS — F419 Anxiety disorder, unspecified: Secondary | ICD-10-CM | POA: Diagnosis not present

## 2023-09-27 DIAGNOSIS — F32A Depression, unspecified: Secondary | ICD-10-CM | POA: Diagnosis not present

## 2023-11-02 DIAGNOSIS — G479 Sleep disorder, unspecified: Secondary | ICD-10-CM | POA: Diagnosis not present

## 2023-11-02 DIAGNOSIS — L709 Acne, unspecified: Secondary | ICD-10-CM | POA: Diagnosis not present

## 2023-11-02 DIAGNOSIS — F32A Depression, unspecified: Secondary | ICD-10-CM | POA: Diagnosis not present

## 2024-03-25 ENCOUNTER — Ambulatory Visit
Admission: EM | Admit: 2024-03-25 | Discharge: 2024-03-25 | Disposition: A | Discharge status: Home / Self Care | Attending: Family Medicine | Admitting: Family Medicine

## 2024-03-25 DIAGNOSIS — L02412 Cutaneous abscess of left axilla: Secondary | ICD-10-CM

## 2024-03-25 MED ORDER — DOXYCYCLINE HYCLATE 100 MG PO CAPS: 100.0000 mg | ORAL_CAPSULE | Freq: Two times a day (BID) | ORAL | 0 refills | Status: AC

## 2024-03-25 NOTE — ED Provider Notes (Addendum)
 [facility]    CSN: [id number] Arrival date & time: 03/25/24  0803      History   Chief Complaint Chief Complaint  Patient presents with   Abscess    HPI Betty Clark is a 33 y.o. female presents for abscess.  Patient reports 1 week of an abscess to her left axilla that began draining yesterday.  She denies any fevers or chills.  No history of MRSA or hidradenitis suppurativa.  Denies pregnancy or breast-feeding.  No other concerns at this time.   Abscess   History reviewed. No pertinent past medical history.  There are no active problems to display for this patient.   Past Surgical History:  Procedure Laterality Date   BACK SURGERY      OB History   No obstetric history on file.      Home Medications    Prior to Admission medications   Medication Sig Start Date End Date Taking? Authorizing Provider  doxycycline (VIBRAMYCIN) 100 MG capsule Take 1 capsule (100 mg total) by mouth 2 (two) times daily. 03/25/24  Yes Loreda Myla SAUNDERS, NP    Family History History reviewed. No pertinent family history.  Social History Social History   Tobacco Use   Smoking status: Never   Smokeless tobacco: Never  Substance Use Topics   Alcohol use: Yes     Allergies   Patient has no known allergies.   Review of Systems Review of Systems  Skin:        Left axilla abscess     Physical Exam Triage Vital Signs ED Triage Vitals  Encounter Vitals Group     BP 03/25/24 0822 (!) 139/94     Girls Systolic BP Percentile --      Girls Diastolic BP Percentile --      Boys Systolic BP Percentile --      Boys Diastolic BP Percentile --      Pulse Rate 03/25/24 0821 72     Resp 03/25/24 0821 18     Temp 03/25/24 0822 99.1 F (37.3 C)     Temp Source 03/25/24 0821 Oral     SpO2 03/25/24 0821 95 %     Weight --      Height --      Head Circumference --      Peak Flow --      Pain Score 03/25/24 0820 4     Pain Loc --      Pain Education --      Exclude  from Growth Chart --    No data found.  Updated Vital Signs BP (!) 139/94 (BP Location: Right Arm)   Pulse 72   Temp 99.1 F (37.3 C) (Oral)   Resp 18   LMP 03/21/2024 (Exact Date)   SpO2 95%   Visual Acuity Right Eye Distance:   Left Eye Distance:   Bilateral Distance:    Right Eye Near:   Left Eye Near:    Bilateral Near:     Physical Exam Vitals and nursing note reviewed.  Constitutional:      General: She is not in acute distress.    Appearance: Normal appearance. She is not ill-appearing.  HENT:     Head: Normocephalic and atraumatic.  Eyes:     Pupils: Pupils are equal, round, and reactive to light.  Cardiovascular:     Rate and Rhythm: Normal rate.  Pulmonary:     Effort: Pulmonary effort is normal.  Skin:  General: Skin is warm and dry.     Comments: There is a 3 x 3 cm indurated draining abscess to the left axilla.  Able to express small amounts of serosanguineous discharge.  Area is mildly tender to palpation.  No erythema or warmth.  Neurological:     General: No focal deficit present.     Mental Status: She is alert and oriented to person, place, and time.  Psychiatric:        Mood and Affect: Mood normal.        Behavior: Behavior normal.      UC Treatments / Results  Labs (all labs ordered are listed, but only abnormal results are displayed) Labs Reviewed - No data to display  EKG   Radiology No results found.  Procedures Procedures (including critical care time)  Medications Ordered in UC Medications - No data to display  Initial Impression / Assessment and Plan / UC Course  I have reviewed the triage vital signs and the nursing notes.  Pertinent labs & imaging results that were available during my care of the patient were reviewed by me and considered in my medical decision making (see chart for details).     Advised to keep clean dry and covered.  Warm compresses frequently.  Doxycycline twice daily for 10 days.  PCP follow-up  if symptoms do not improve.  ER precautions reviewed Final Clinical Impressions(s) / UC Diagnoses   Final diagnoses:  Cutaneous abscess of left axilla     Discharge Instructions      Continue to keep area clean and dry.  You may do warm compresses to that will encourage it to continue to drain.  Start doxycycline twice daily for 10 days.  Please follow-up with your PCP if your symptoms do not improve.  Please go to the ER for any worsening symptoms.  Hope you feel better soon!    ED Prescriptions     Medication Sig Dispense Auth. Provider   doxycycline (VIBRAMYCIN) 100 MG capsule Take 1 capsule (100 mg total) by mouth 2 (two) times daily. 20 capsule Namon Villarin, Jodi R, NP      PDMP not reviewed this encounter.   Loreda Myla SAUNDERS, NP 03/25/24 0849    Loreda Myla SAUNDERS, NP 03/25/24 [phone]

## 2024-03-25 NOTE — ED Triage Notes (Signed)
 Pt present with c/o an abscess under the lt arm. Pt states it started draining yesterday. Pt is requesting abx.

## 2024-03-25 NOTE — Discharge Instructions (Addendum)
 Continue to keep area clean and dry.  You may do warm compresses to that will encourage it to continue to drain.  Start doxycycline twice daily for 10 days.  Please follow-up with your PCP if your symptoms do not improve.  Please go to the ER for any worsening symptoms.  Hope you feel better soon!
